# Patient Record
Sex: Male | Born: 1970 | Race: White | Hispanic: No | Marital: Single | Smoking: Never smoker
Health system: Western US, Academic
[De-identification: ages and names within clinical notes are randomized; demographics above are authoritative.]

---

## 2010-11-16 HISTORY — PX: OTHER PROCEDURE: U1053

## 2014-11-16 HISTORY — PX: KIDNEY STONE SURGERY: SHX686

## 2018-01-05 LAB — HEMOGLOBIN A1C - EXTERNAL
Estimated Mean Glucose: 100 mg/dL — NL
Hemoglobin A1C: 5.1 % — NL (ref <5.7)

## 2019-01-06 LAB — HEMOGLOBIN A1C - EXTERNAL
Estimated Mean Glucose: 100 mg/dL — NL
Hemoglobin A1C: 5.1 % — NL (ref <5.7)

## 2019-05-17 HISTORY — PX: URETEROLITHOTOMY: SHX71

## 2019-08-17 HISTORY — PX: URETHROPLASTY: SHX499

## 2019-10-17 HISTORY — PX: OTHER PROCEDURE: U1053

## 2020-06-20 ENCOUNTER — Other Ambulatory Visit: Payer: Self-pay

## 2020-06-20 ENCOUNTER — Other Ambulatory Visit (INDEPENDENT_AMBULATORY_CARE_PROVIDER_SITE_OTHER): Payer: Self-pay | Attending: Acute Care

## 2020-06-20 ENCOUNTER — Telehealth (INDEPENDENT_AMBULATORY_CARE_PROVIDER_SITE_OTHER): Payer: Self-pay | Admitting: Family

## 2020-06-20 DIAGNOSIS — Z1159 Encounter for screening for other viral diseases: Secondary | ICD-10-CM | POA: Insufficient documentation

## 2020-06-20 LAB — COVID-19 DETECTION ASSAY ASYM: COVID-19 Coronavirus Result: DETECTED — AB

## 2020-06-20 NOTE — Telephone Encounter (Signed)
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      Robin Searing, NP  Signature Derived From Controlled Access Password, June 20, 2020, 4:51 PM

## 2020-06-21 NOTE — Telephone Encounter (Signed)
Triage Assessment/Expected Management Plan:  First day of symptoms: 8/2  Expected last day of isolation: 8/12  Expected first day out of isolation: 8/13    Positive COVID-19 PCR 1st Call [employer] or 3rd Party Patient (NOT FOR [place] STUDENTS OR EMPLOYEES OR [place] PATIENTS)  PCP:  [facility]    Lab Results   Component Value Date    CO19 DETECTED (A) 06/20/2020         Triage:  1. What symptoms does the patient have today?  99.5 TEMP, MILD HEADACHE, LIGHTHEADED UNSURE IF DUE TO LOW B/P (HOWEVER, NOT @ THIS TIME !)  O2 : 95-98% , GETS LIGHTHEADED RANDOMLY, CAN CLIMB UP STAIRS JUST FINE    2. Was patient informed that if they have increasing SOB, new chest pain, coughing up blood after hours they should go the ED or call 911? Yes     3. Did you inform patient that they should contact their own PCP or health system if they have any further questions regarding management of their COVID infection? Yes    Isolation:  4. Date of first symptoms: 8/2  a. Length of Isolation (10 days or >10 days) Consider longer isolation period for patients with immune suppression due to chronic conditions like chemotherapy for cancer, HIV, chronic corticosteroid use etc.      DECLINED    5. Patient received home isolation instructions and all questions answered (smartphrase .[email or web address] be sent via MyChart to patient)?  Yes     6. Reviewed the 3 criteria for discharge from home isolation?  Yes     Contact Tracing:  7. Was close contact self quarantine information reviewed with the patient?  Yes    Close contacts of confirmed COVID-19 cases are required to self-quarantine for 10 days after their last contact with the infectious person. If they remain asymptomatic, after Day 10 they can discontinue quarantine on the condition that they follow these additional precautions from days 11-14:  o Strictly adhere to all routine COVID-19 prevention interventions including, wearing a face covering whenever around other people (or  Personal Protective Equipment, as required), keeping a distance of at least 6 feet from others, and washing hands often, AND   o Continue to monitor daily for COVID-19 symptoms. If symptoms develop, they must isolate immediately and contact their healthcare provider, clinician advice line, or telemedicine provider for a medical assessment and arrange a test for COVID-19.   Anne Shutter is not required after exposure in the following circumstances:  o Exposed people who had COVID-19 infection in the last 3 months (counting from the first day of symptoms or counting from the day of the positive test if asymptomatic)  o Fully vaccinated individuals meeting the following conditions:  - ?2 weeks following receipt of the second dose in a 2-dose series, or ?2 weeks following receipt of one dose of a single-dose vaccine  - Are within 3 months following receipt of the last dose in the series  - Have remained asymptomatic since the current COVID-19 exposure  8. Have you traveled out of the country in the last 2 weeks? No     Notify App:  9. Did the patient download and are they participating in the [place] COVID Notify App? No (If yes, then patient will need activation code from )    Monoclonal Antibody Therapy Eligibility:   10. Does the patient have mild/ moderate COVID-19 virus and meet criteria for Monoclonal Antibody Therapy? Yes    BMI: 26.3  HEIGHT 5'8  WEIGHT 173    COVID Vaccination Status:  11. Has the patient received any doses of the COVID vaccine: Yes     Date of 2nd vaccine:  April 2021   Which vaccine:  PFIZER      Benjamine Sprague, LVN  Signature Derived From Controlled Access Password, June 21, 2020, 11:36 AM

## 2020-06-21 NOTE — Telephone Encounter (Signed)
Provider Review Initial Call  Symptomatic Patient  First day of Symptoms: 8/2  Expected last day of isolation: 8/12  Expected First day out of isolation: 8/13  [facility] pt

## 2020-11-22 LAB — HEMOGLOBIN A1C - EXTERNAL
Estimated Mean Glucose: 105 mg/dL
Hemoglobin A1C: 5.3 % (ref ?–5.7)

## 2021-02-05 ENCOUNTER — Encounter (INDEPENDENT_AMBULATORY_CARE_PROVIDER_SITE_OTHER): Payer: Self-pay | Admitting: Cardiovascular Disease

## 2021-02-27 ENCOUNTER — Encounter (INDEPENDENT_AMBULATORY_CARE_PROVIDER_SITE_OTHER): Payer: Self-pay | Admitting: Cardiovascular Disease

## 2021-02-27 ENCOUNTER — Ambulatory Visit (INDEPENDENT_AMBULATORY_CARE_PROVIDER_SITE_OTHER): Admitting: Cardiovascular Disease

## 2021-02-27 VITALS — BP 133/84 | HR 67 | Temp 96.4°F | Resp 14 | Ht 68.75 in | Wt 176.0 lb

## 2021-02-27 MED ORDER — VITAMIN D3 1000 UNIT OR TABS: 1.00 | ORAL_TABLET | Freq: Two times a day (BID) | ORAL | Status: AC

## 2021-02-27 MED ORDER — OMEGA-3 FISH OIL 1000 MG PO CAPS: 2.00 g | ORAL_CAPSULE | Freq: Every day | ORAL | Status: AC

## 2021-02-27 NOTE — Progress Notes (Signed)
 [facility] Outpatient Note    Subjective: Jon Hernandez is a 50 year old male who presents to clinic for Establish Care and Referral/authorization (Derm, sleep specialist)    HPI   Establish care  Hx renal calculi, urethral stricture repair  Hx melanoma x 2- 2012, 2020  Normal colonoscopy 2020  No hx tob  5 beers/week  Runs 5 days/week  covid august- has vaccin and booster  Palpitations 10 yrs ago but resolved after starting cpap for osa    Review of Systems:     General - no fevers, chills, fatigue, unintentional weight loss, headache  Endocrine - no feeling unusually hot or cold (intolerance), hair loss  Oral - no bleeding from gums, open sores  Ear/Nose/Throat - no nose bleeds, sudden change in hearing, sore throat, runny nose  Lungs - no cough, shortness of breath, hemoptysis, wheezing  GI - no bloody or black stools, abdominal pain, nausea, vomiting, diarrhea, change in bowel habits  Heart - no chest pain, pressure, irregular heartbeat/palpitations, leg swelling  Neurologic - no weakness, numbness or tingling, problems walking  MusculoSkeletal - no new joint pain, joint swelling, or warmth/stiffness  Skin - no rash, no changing skin lesions, itching  Psychiatric - no depression, behavior changes, significant anxiety or sleep disturbance  Kidney - no changes in urination, blood in urine, pain with urination, frequency, nocturia  Heme - no unusual easy bleeding or bruising    Outpatient Medications Prior to Visit   Medication Sig Dispense Refill   . Cholecalciferol (VITAMIN D3) 25 MCG (1000 UT) tablet 1 tablet by Oral route in the morning and at bedtime.     Marland Kitchen omega-3 fatty acids, OTC, 1000 MG CAPS 2 g by Oral route daily.       No facility-administered medications prior to visit.     Immunization History   Administered Date(s) Administered   . COVID-19 (Moderna) 10/02/2020   . COVID-19 [employer]) Purple Cap 02/13/2020, 03/08/2020     Allergies   Allergen Reactions   . Penicillins Rash     There are no  problems to display for this patient.    Past Medical History:   Diagnosis Date   . OSA (obstructive sleep apnea)      Past Surgical History:   Procedure Laterality Date   . melanoma excision  10/2019   . URETHROPLASTY  08/2019   . URETEROLITHOTOMY  05/2019   . KIDNEY STONE SURGERY  2016    hospitalized 3 days   . melanoma excision  2012     Social History     Socioeconomic History   . Marital status: Single     Spouse name: Not on file   . Number of children: Not on file   . Years of education: Not on file   . Highest education level: Not on file   Occupational History   . Not on file   Tobacco Use   . Smoking status: Never Smoker   . Smokeless tobacco: Never Used   Substance and Sexual Activity   . Alcohol use: Yes     Alcohol/week: 5.0 standard drinks     Types: 5 Standard drinks or equivalent per week     Comment: twice a week   . Drug use: Never   . Sexual activity: Yes     Partners: Female     Birth control/protection: Condom   Other Topics Concern   . Not on file   Social History Narrative   .  Not on file     Social Determinants of Health     Financial Resource Strain: Not on file   Food Insecurity: Not on file   Transportation Needs: Not on file   Physical Activity: Not on file   Stress: Not on file   Social Connections: Not on file   Intimate Partner Violence: Not on file   Housing Stability: Not on file     Family History   Problem Relation Name Age of Onset   . Cancer Mother     . Cancer Father     . No Known Problems Maternal Grandmother     . No Known Problems Maternal Grandfather     . No Known Problems Paternal Grandmother     . No Known Problems Paternal Grandfather     . No Known Problems Brother       Family Status   Relation Status   . Mo (Not Specified)   . Fa (Not Specified)   . [facility] (Not Specified)   . MGFa (Not Specified)   . [facility] (Not Specified)   . PGFa (Not Specified)   . Bro (Not Specified)     Objective:  Vitals:    02/27/21 0813   BP: 133/84   BP Location: Left arm   BP Patient Position:  Sitting   BP cuff size: Regular   Pulse: 67   Resp: 14   Temp: 96.4 F (35.8 C)   TempSrc: Temporal   SpO2: 97%   Weight: 79.8 kg (176 lb)   Height: 5' 8.75" (1.746 m)     Body mass index is 26.18 kg/m.    Wt Readings from Last 5 Encounters:   02/27/21 79.8 kg (176 lb)     Blood Pressure   02/27/21 133/84     Physical Exam  Gen: in NAD, A&O, pleasant, non-toxic appearing  HEENT: NC/AT, no conjunctival pallor, PERRL. No icterus, ptosis. Oropharynx w/out exudates or erythema.  Moist mucous membranes.  Neck: No thyromegaly, no LAD.  Lungs: clear to auscultation bilaterally. No chest deformities noted.  No wheeze/rales/rhonchi   CV: regular rate & rhythm, nl S1, S2. No murmurs  Abdomen: Soft, non-tender, non-distended. No masses, organomegaly. No rebound tenderness.  Extremities: No cyanosis, erythema or edema. Warm, well perfused, capillary refill <2 sec  Neurologic: Mentation appropriate. No facial droop.  No tremor.  Musculoskeletal: no swelling of observed joints  Skin: exposed areas free of rash and no significant lesions     Labs:  Results for orders placed or performed in visit on 06/20/20   COVID-19 Detection Assay Asym   Result Value Ref Range    COVID-19 Source Nasal     COVID-19 Coronavirus Result DETECTED (A)      Imaging:  No results found.    ASSESSMENT AND PLAN:  1. Annual physical exam  wnl    2. OSA (obstructive sleep apnea)    - Consult/Referral to Sleep Medicine- nancy ludwick    3. Malignant melanoma, unspecified site (CMS-HCC)    - Dermatology Clinic-[facility]    4. Kidney stone  X 2 2016 and 2020    5. 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    Health Maintenance   Topic Date Due   . Hepatitis C Screening  Never done   . PHQ2 depression screen  Never done   . Colorectal Cancer Screening  Never done   . Tetanus (2 - Td or Tdap) 09/27/2022   . Influenza  Completed   . Shingles Vaccine  Completed   .  COVID-19 Vaccine  Completed   . Polio Vaccine  Aged Out   . HPV Vaccine <= 26 Yrs  Aged Out   . Meningococcal MCV4 Vaccine  Aged Out   . Pneumococcal Vaccine  Aged Out     Follow up  No follow-ups on file.    No future appointments.    Davina Poke, MD

## 2022-04-02 ENCOUNTER — Encounter (INDEPENDENT_AMBULATORY_CARE_PROVIDER_SITE_OTHER): Payer: Self-pay | Admitting: Cardiovascular Disease

## 2022-04-02 ENCOUNTER — Ambulatory Visit (INDEPENDENT_AMBULATORY_CARE_PROVIDER_SITE_OTHER): Admitting: Cardiovascular Disease

## 2022-04-02 VITALS — BP 131/71 | HR 65 | Temp 96.4°F | Resp 14 | Ht 68.75 in | Wt 170.0 lb

## 2022-04-02 NOTE — Progress Notes (Signed)
[facility] Outpatient Note    Subjective: Jon Hernandez is a 51 year old male who presents to clinic for Physical and Referral/authorization (Sleep medicine, dermatology)    HPI   Annual  Doing well  Needs ref to derm (hx melanoma) and needs supplies for CPAP    Review of Systems:     General - no fevers, chills, fatigue, unintentional weight loss, headache  Endocrine - no feeling unusually hot or cold (intolerance), hair loss  Oral - no bleeding from gums, open sores  Ear/Nose/Throat - no nose bleeds, sudden change in hearing, sore throat, runny nose  Lungs - no cough, shortness of breath, hemoptysis, wheezing  GI - no bloody or black stools, abdominal pain, nausea, vomiting, diarrhea, change in bowel habits  Heart - no chest pain, pressure, irregular heartbeat/palpitations, leg swelling  Neurologic - no weakness, numbness or tingling, problems walking  MusculoSkeletal - no new joint pain, joint swelling, or warmth/stiffness  Skin - no rash, no changing skin lesions, itching  Psychiatric - no depression, behavior changes, significant anxiety or sleep disturbance  Kidney - no changes in urination, blood in urine, pain with urination, frequency, nocturia  Heme - no unusual easy bleeding or bruising    Outpatient Medications Prior to Visit   Medication Sig Dispense Refill   . Cholecalciferol (VITAMIN D3) 25 MCG (1000 UT) tablet 1 tablet (1,000 Units) by Oral route in the morning and at bedtime.     Marland Kitchen omega-3 fatty acids, OTC, 1000 MG CAPS 2 capsules (2 g) by Oral route daily.       No facility-administered medications prior to visit.     Immunization History   Administered Date(s) Administered   . COVID-19 (Moderna) Red Cap >= 12 Years 10/02/2020   . COVID-19 [employer]) Purple Cap >= 12 Years 02/13/2020, 03/08/2020     Allergies   Allergen Reactions   . Penicillins Rash     There are no problems to display for this patient.    Past Medical History:   Diagnosis Date   . OSA (obstructive sleep apnea)      Past  Surgical History:   Procedure Laterality Date   . melanoma excision  10/2019   . URETHROPLASTY  08/2019   . URETEROLITHOTOMY  05/2019   . KIDNEY STONE SURGERY  2016    hospitalized 3 days   . melanoma excision  2012     Social History     Socioeconomic History   . Marital status: Single     Spouse name: Not on file   . Number of children: Not on file   . Years of education: Not on file   . Highest education level: Not on file   Occupational History   . Not on file   Tobacco Use   . Smoking status: Never     Passive exposure: Never   . Smokeless tobacco: Never   Vaping Use   . Vaping Use: Never used   Substance and Sexual Activity   . Alcohol use: Yes     Alcohol/week: 5.0 standard drinks of alcohol     Types: 5 Standard drinks or equivalent per week     Comment: twice a week   . Drug use: Never   . Sexual activity: Yes     Partners: Female     Birth control/protection: Condom   Other Topics Concern   . Not on file   Social History Narrative   . Not on file  Social Determinants of Health     Financial Resource Strain: Not on file   Food Insecurity: Not on file   Transportation Needs: Not on file   Physical Activity: Not on file   Stress: Not on file   Social Connections: Not on file   Intimate Partner Violence: Not on file   Housing Stability: Not on file     Family History   Problem Relation Name Age of Onset   . Cancer Mother     . Cancer Father     . No Known Problems Maternal Grandmother     . No Known Problems Maternal Grandfather     . No Known Problems Paternal Grandmother     . No Known Problems Paternal Grandfather     . No Known Problems Brother       Family Status   Relation Status   . Mo (Not Specified)   . Fa (Not Specified)   . [facility] (Not Spec[facility])   . MGFa (Not Specified)   . [facility] (Not [facility]   . PGFa (Not Specified)   . Bro (Not Specified)     Objective:  Vitals:    04/02/22 0955   BP: 131/71   BP Location: Left arm   BP Patient Position: Sitting   BP cuff size: Regular   Pulse: 65   Resp: 14    Temp: 96.4 F (35.8 C)   TempSrc: Temporal   SpO2: 96%   Weight: 77.1 kg (170 lb)   Height: 5' 8.75" (1.746 m)     Body mass index is 25.29 kg/m.    Wt Readings from Last 5 Encounters:   04/02/22 77.1 kg (170 lb)   02/27/21 79.8 kg (176 lb)     Blood Pressure   04/02/22 131/71   02/27/21 133/84     Physical Exam  Gen: in NAD, A&O, pleasant, non-toxic appearing  HEENT: NC/AT, no conjunctival pallor, PERRL. No icterus, ptosis. Oropharynx w/out exudates or erythema.  Moist mucous membranes.  Neck: No thyromegaly, no LAD.  Lungs: clear to auscultation bilaterally. No chest deformities noted.  No wheeze/rales/rhonchi   CV: regular rate & rhythm, nl S1, S2. No murmurs  Abdomen: Soft, non-tender, non-distended. No masses, organomegaly. No rebound tenderness.  Extremities: No cyanosis, erythema or edema. Warm, well perfused, capillary refill <2 sec  Neurologic: Mentation appropriate. No facial droop.  No tremor.  Musculoskeletal: no swelling of observed joints  Skin: exposed areas free of rash and no significant lesions     Labs:  Results for orders placed or performed in visit on 06/20/20   COVID-19 Detection Assay Asym   Result Value Ref Range    COVID-19 Source Nasal     COVID-19 Coronavirus Result DETECTED (A)      Imaging:  No results found.    ASSESSMENT AND PLAN:  1. Annual physical exam  WNL  - Comprehensive Metabolic Panel; Future  - CBC w/ Diff Lavender; Future  - Lipid Panel Green Plasma Separator Tube; Future  - PSA (Screen), Blood - See Instructions; Future  - Comprehensive Metabolic Panel  - CBC w/ Diff Lavender  - Lipid Panel Green Plasma Separator Tube  - PSA (Screen), Blood - See Instructions  - UA    2. OSA (obstructive sleep apnea)  For supplies for his CPAP  - Consult/Referral to Sleep Medicine [facility]    3. Kidney stone  No current symptoms but check UA    4. History of melanoma    -  Dermatology Clinic    5. Low vitamin D level  On supplement  - Vitamin D, 25-OH Total Yellow serum separator tube;  Future  - Vitamin D, 25-OH Total Yellow serum separator tube      There are no Patient Instructions on file for this visit.    Health Maintenance   Topic Date Due   . Colorectal Cancer Screening  Never done   . PHQ2 depression screen  Never done   . Hepatitis C Screening  Never done   . COVID-19 Vaccine (4 - Booster for Pfizer series) 11/27/2020   . Influenza (Season Ended) 06/16/2022   . Tetanus (2 - Td or Tdap) 09/27/2022   . Shingles Vaccine  Completed   . Polio Vaccine  Aged Out   . IMM_Hep A Vaccine Series  Aged Out   . HPV Vaccine <= 26 Yrs  Aged Out   . Meningococcal MCV4 Vaccine  Aged Out   . Pneumococcal Vaccine  Aged Out     Follow up  No follow-ups on file.    No future appointments.    Davina Poke, MD

## 2022-04-14 IMAGING — CT CT MAXILLOFACIAL W/O CM
3 series · 15 of 47 positions shown, 18 images · non-contrast
Comparison: None.

CLINICAL DATA: Other specified disorders of nose and nasal sinuses.
Intermittent difficulty breathing from the left nostril.

EXAM:
CT MAXILLOFACIAL WITHOUT CONTRAST
TECHNIQUE: Multidetector CT images of the paranasal sinuses were obtained using
the standard protocol without intravenous contrast.

[Series 2: max soft · axial · 0.31mm/px · z∈[-41,+73]mm · 9 of 67 slices shown, 12 images]
[im 5/67  brain]
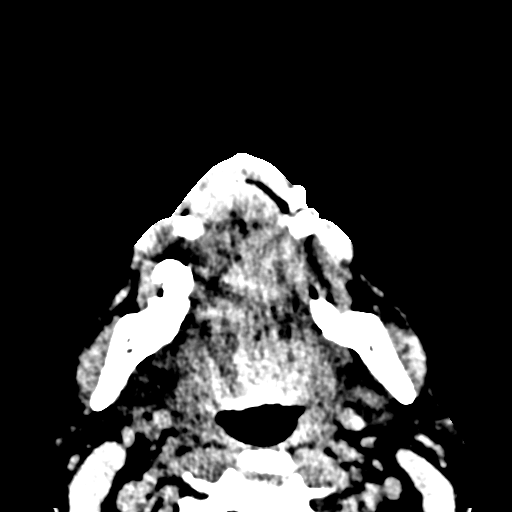
[im 5/67  bone]
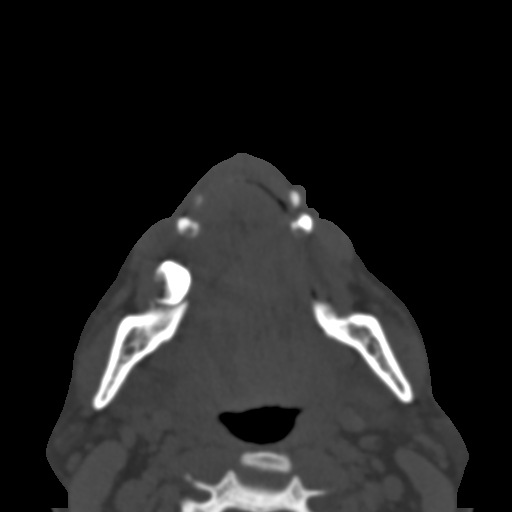
[im 12/67  bone]
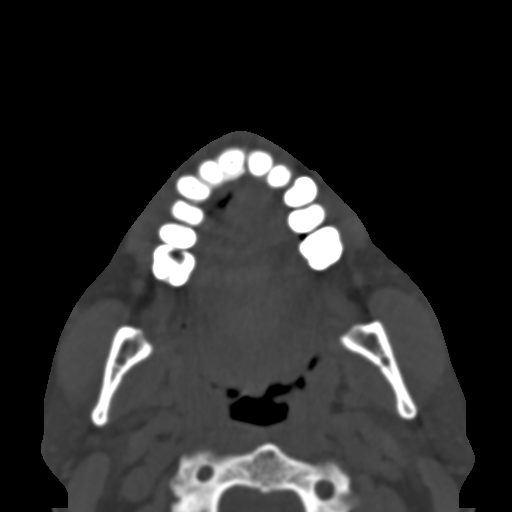
[im 19/67  bone]
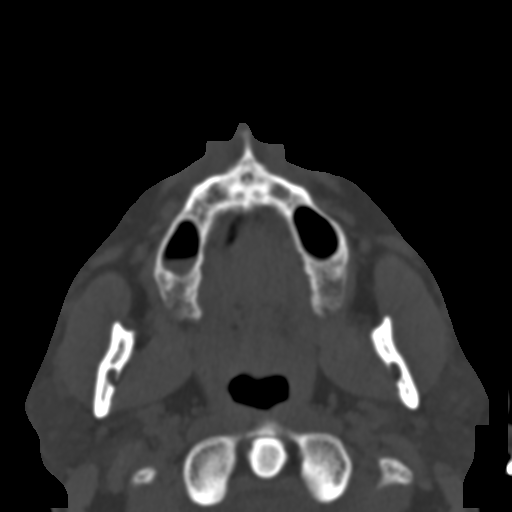
[im 26/67  bone]
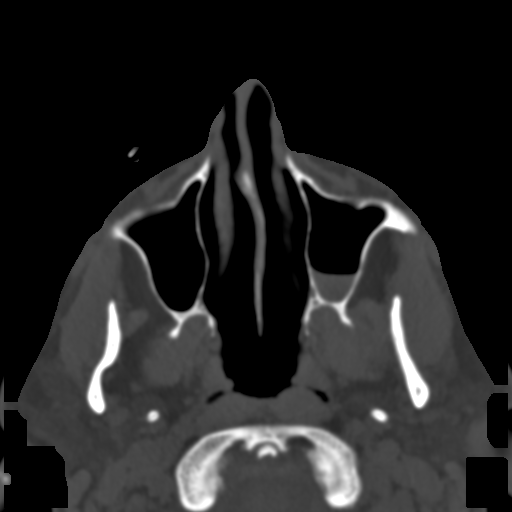
[im 35/67  brain]
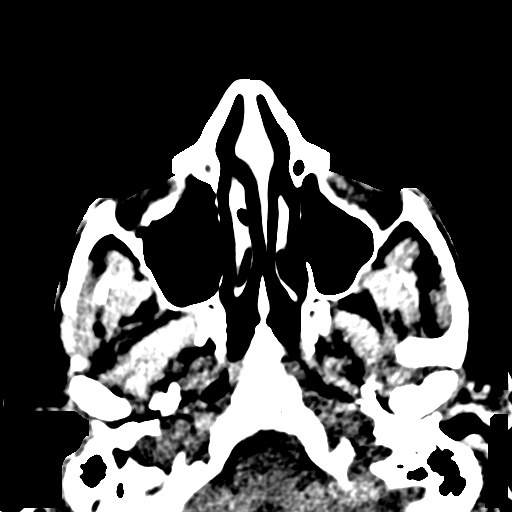
[im 35/67  bone]
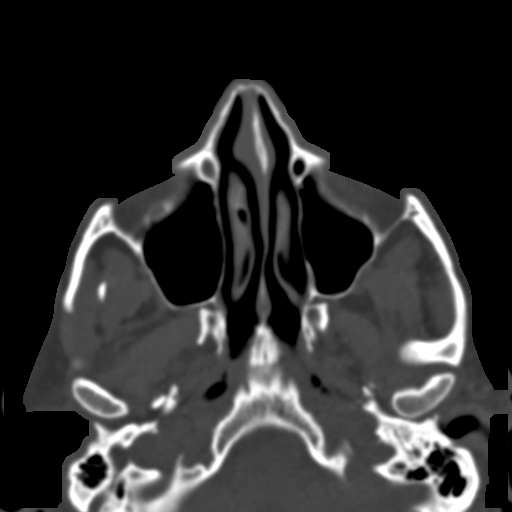
[im 41/67  bone]
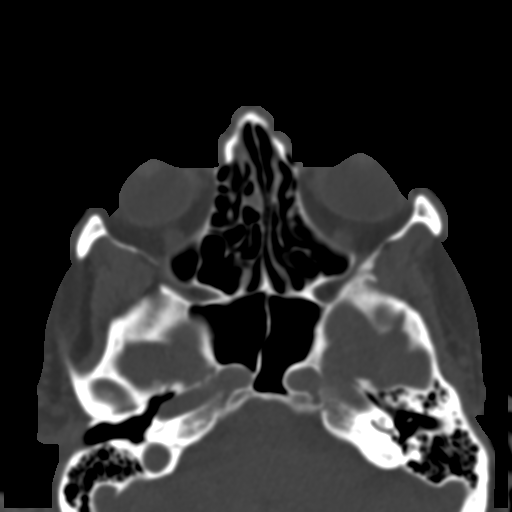
[im 48/67  bone]
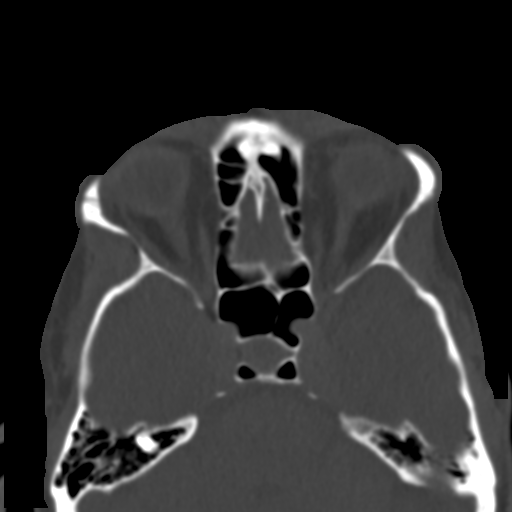
[im 55/67  bone]
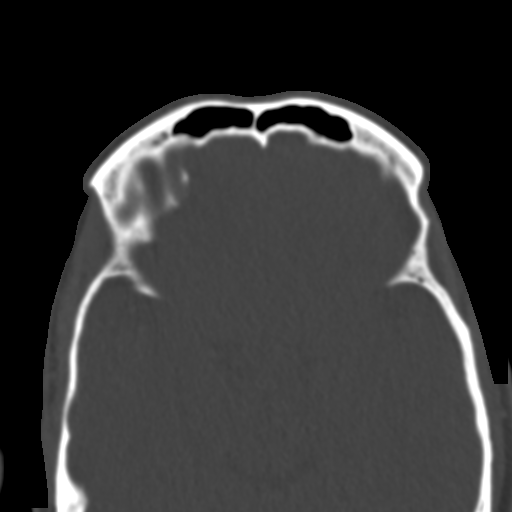
[im 62/67  brain]
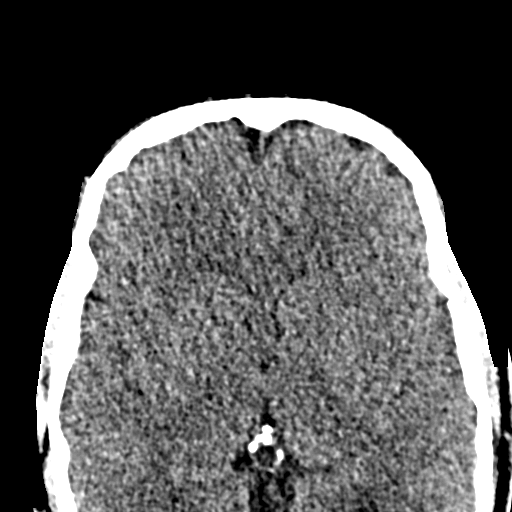
[im 62/67  bone]
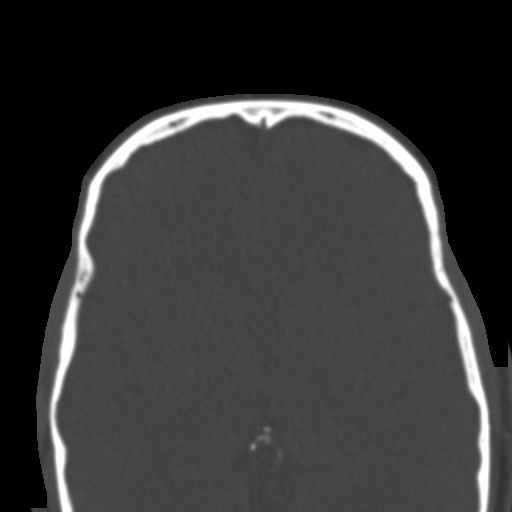

[Series 6: coronal soft · coronal · 0.30mm/px · 3 of 81 slices shown]
[im 27/81  bone]
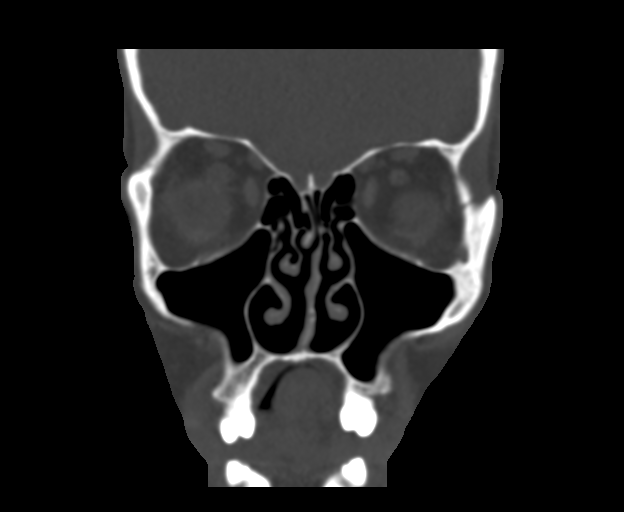
[im 36/81  bone]
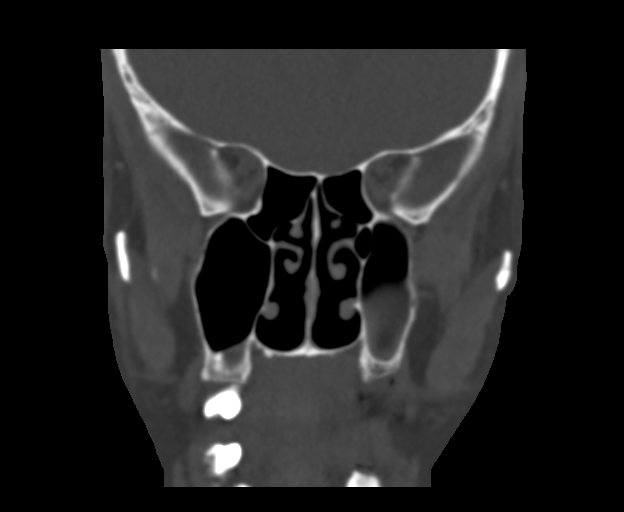
[im 45/81  bone]
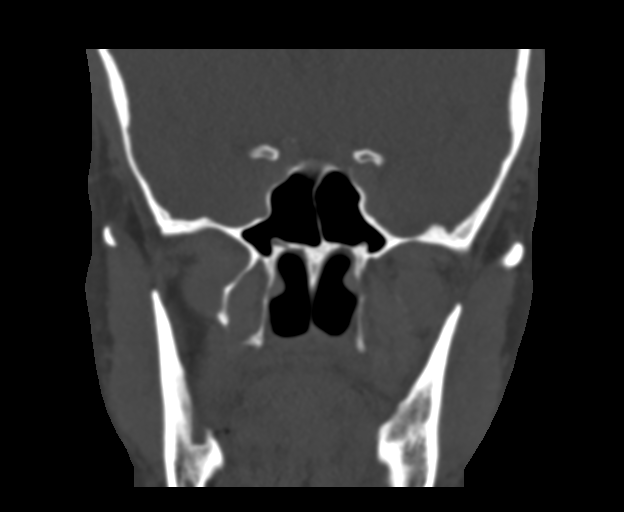

[Series 7: sagittal soft · sagittal · 0.29mm/px · 3 of 87 slices shown]
[im 29/87  bone]
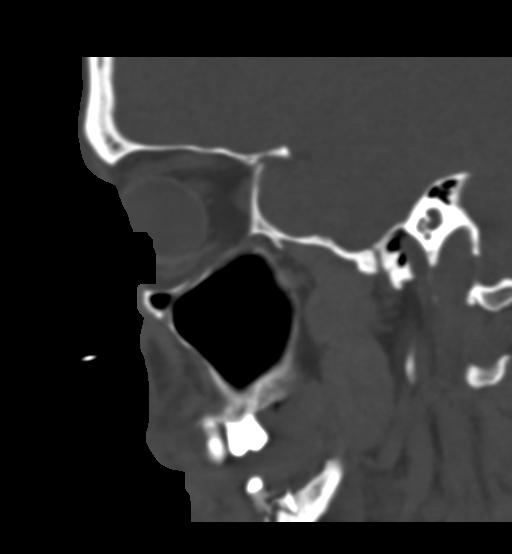
[im 44/87  bone]
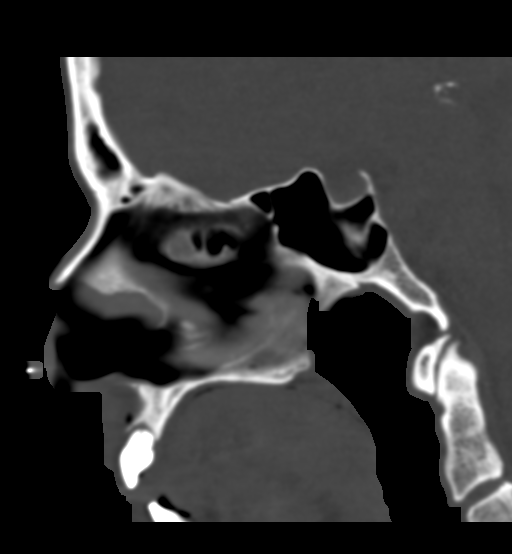
[im 58/87  bone]
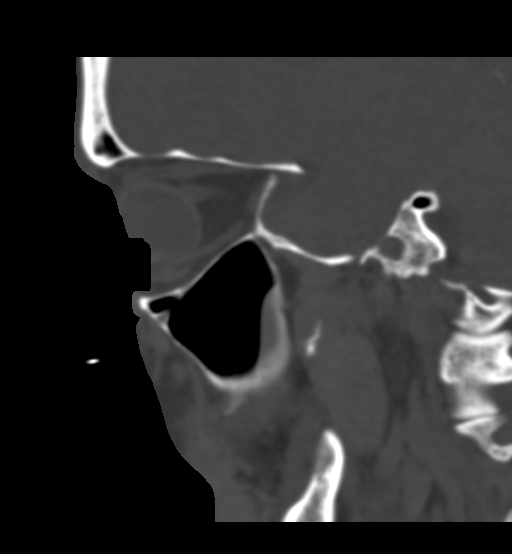

[15 of 47 positions shown; findings below may reference images not displayed]

FINDINGS: Paranasal sinuses:

Frontal: Normally aerated. Patent frontal sinus drainage pathways.

Ethmoid: Normally aerated.

Maxillary: Mild mucosal thickening bilaterally.

Sphenoid: Normally aerated. Patent sphenoethmoidal recesses.

Right ostiomeatal unit: Patent.

Left ostiomeatal unit: Patent.

Nasal passages: Right middle turbinate concha bullosa. Paradoxical
rotation of the right middle turbinate. 5 mm leftward deviation of
an intact nasal septum.

Anatomy: No pneumatization superior to anterior ethmoid notches.
Keros II. Sellar sphenoid pneumatization pattern. No dehiscence of
carotid or optic canals. No onodi cell.

Other: Clear mastoid air cells and tympanic cavities. Unremarkable
appearance of the orbits and included portion of the brain.
IMPRESSION: 1. Mild bilateral maxillary sinus mucosal thickening.
2. Leftward nasal septal deviation.

## 2022-06-24 ENCOUNTER — Encounter (INDEPENDENT_AMBULATORY_CARE_PROVIDER_SITE_OTHER): Payer: Self-pay | Admitting: Hospital

## 2022-07-21 ENCOUNTER — Ambulatory Visit (INDEPENDENT_AMBULATORY_CARE_PROVIDER_SITE_OTHER): Admitting: Family Medicine

## 2022-07-21 ENCOUNTER — Encounter (INDEPENDENT_AMBULATORY_CARE_PROVIDER_SITE_OTHER): Payer: Self-pay | Admitting: Family Medicine

## 2022-07-21 VITALS — BP 116/75 | HR 60 | Ht 69.5 in | Wt 175.0 lb

## 2022-07-21 NOTE — Progress Notes (Signed)
SLEEP MEDICINE INITIAL CONSULTATION    Jon Hernandez is a 51 year old male with a history of OSA on APAP, dyslipidemia seen today as a new consultation at the request of Davina Poke for the chief complaint of OSA follow up and management.  Patient was dx with OSA in 2013, AHI 27.8 and has been on PAP therapy since. Study was ordered for loud snoring, unrefreshing sleep and witnessed apneas by a partner at the time. He noted much benefit with PAP therapy. Patient has no concerns about PAP machine, mask or pressure. Doing well and clinically benefiting from therapy. Received dreamstation machine 02/2019 and replacement dreamstation2 11/2020. He presents to establish care- insurance change. Was seeing sleep at [facility], then Dr Jerre Simon. No sleep concerns at this time.    Sleep wake schedule:    Patient's time to bed is 1030 and wake up time is 6a. It takes him a few minutes to fall asleep. he  has 0-1 nocturnal awakenings due to urination. It takes a few minutes to fall back asleep. he  does maintain same schedule on weekends. Conran denies scheduled naps    Sleep Complaints:  Excessive daytime sleepiness: no  Nonrestorative sleep: no  Daytime fatigue: no    Cardio-respiratory- yes w/o pap  Snoring: no  Witnessed apneas: no  Waking up gasping/Choking: no  Waking up with heart pounding or racing: yes  Excessive perspiration: no    RLS screen: Unpleasant, restless feeling in legs in the evening or while sleeping at night that can be relieved by walking or movement: no    Parasomnias:   NREM:  Denies recurrent persistent confusional arousal, night eating, sleep walking or sleep terrors.  REM:  Denies dream reenactment or injuries  no Hypnogogic/hypnopompic hallucinations, sleep paralysis and cataplexy    Other subjective complaints:  History of mood disorder:no  History of pain: no  Nocturnal GERD: no  Dry mouth: no  Morning headache or confusion: no  Teeth grinding: no  Nocturia: 0-1 /night    Comorbid  conditions: OSA    Caffeine use: 12-24 oz  Sleep aids tried: n/a    Epworth Sleepiness Scale today is 3 (normal <10).     Review of Systems:  Refer to intake questionnaire    Past Medical and Surgical History:     Past Medical History:   Diagnosis Date    OSA (obstructive sleep apnea)      Past Surgical History:   Procedure Laterality Date    melanoma excision  10/2019    URETHROPLASTY  08/2019    URETEROLITHOTOMY  05/2019    KIDNEY STONE SURGERY  2016    hospitalized 3 days    melanoma excision  2012       Allergies: Penicillins.     Current Medications:     Current Outpatient Medications   Medication Sig    Cholecalciferol (VITAMIN D3) 25 MCG (1000 UT) tablet 1 tablet (1,000 Units) by Oral route in the morning and at bedtime.    omega-3 fatty acids, OTC, 1000 MG CAPS 2 capsules (2 g) by Oral route daily.     No current facility-administered medications for this visit.       Family History:    Family History   Problem Relation Name Age of Onset    Cancer Mother      Cancer Father      No Known Problems Maternal Grandmother      No Known Problems Maternal Grandfather  No Known Problems Paternal Grandmother      No Known Problems Paternal Grandfather      No Known Problems Brother         There is no known family history of obstructive sleep apnea.    Social History:    Alcohol, tobacco, illicit drug use: 0-5 drinks/week    Physical Examination:  Vitals: BP 116/75 (BP Location: Left arm, BP Patient Position: Sitting, BP cuff size: Regular)   Pulse 60   Ht 5' 9.5" (1.765 m)   Wt 79.4 kg (175 lb)   BMI 25.47 kg/m     Exam:  - Gen: Well-appearing, no distress.  - HEENT:   - General: Benign oropharynx without exudate or erythema.   - Airway/tonsils: Mallampati 3 airway. 1+ tonsils.   - Pharyngeal structure: No evidence of lateral pharyngeal wall mucosa hypertrophy. No evidence of high arched palate. Uvula is not enlarged or erythematous. Posterior oropharynx is shallow in the A/P diameter.   - Tongue: No  macroglossia.   - Chin: No micro/retrognathia.   - Nasal structure: No deviated septum.  - Neck: No thyromegaly. Grossly normal. Neck circumference: 15  - CV: Regular rate and rhythm, normal S1/S2, no m/g/r.  - Pulm: Clear breath sounds bilaterally. No crackles or wheezes. Nonlabored breathing.  - Ext: No clubbing or edema.    Significant Diagnostics:  PSG 08/11/2012 AHI 27.8 reported 05/24/2020, not available to me     CPAP adherence data from 06/11/22 to 07/10/22 show:  - Mode: APAP.  - Pressure 5-15 cm H2O.  - Median pressure 6.9 cm H2O and 95th percentile pressure 8.3 cm H2O.  - Used on 96.7% of days, with >4 hours on 83% of days.  - Average use: 6h75min (days used) and 5h50min (overall).  - Residual AHI of 2.7 events/hr.      Assessment and Plan:    Jon Hernandez is a 51 year old male seen today as a new consultation at the request of Davina Poke for the chief complaint of OSA follow up and management.    Patient has a h/o moderate OSA, on PAP, clinically benefitting from therapy.  New supplies ordered.   Ensure 7-9 hours of TST.  Patient is compliant with PAP usage of greater than 4 hours per night, 70% of nights per week.     Follow up yearly    I have reviewed the  pathogenesis of obstructive sleep apnea and the natural course of the disorder if left untreated. Treatment of OSA reduces risk of developing stroke, congestive heart failure, myocardial infarction and premature death.   Factors that may exacerbate OSA including weight gain, insufficient sleep, certain medications and alcohol consumption were discussed and behavioral modifications including weight loss and exercise were discussed.    The risks of hypersomnolence including drowsy driving were discussed    All questions were answered as able.  I spent more than 50% of our visit in education and counseling related to disease management for total time of 40 minutes.    Ladell Heads, MD, MBA  Diplomate in Sleep Medicine    Electronically  signed by Ladell Heads, MD on July 21, 2022 14:54

## 2022-07-21 NOTE — Interdisciplinary (Signed)
I, Jon Hernandez, roomed and reviewed all medications and allergies for the patient.     ER screening: none in past 3 months

## 2022-08-28 ENCOUNTER — Encounter (INDEPENDENT_AMBULATORY_CARE_PROVIDER_SITE_OTHER): Payer: Self-pay | Admitting: Hospital

## 2022-09-08 ENCOUNTER — Telehealth (INDEPENDENT_AMBULATORY_CARE_PROVIDER_SITE_OTHER): Payer: Self-pay | Admitting: Cardiovascular Disease

## 2022-09-08 NOTE — Telephone Encounter (Signed)
[facility] CALLED REGARDING MED REC REQUEST, INFORMED THEM NOTHING SCANNED TO PT CHART AS OF YET.

## 2022-09-10 NOTE — Telephone Encounter (Signed)
Albert from [facility] called to follow up on medical records request nothing has been scanned.

## 2022-09-14 NOTE — Telephone Encounter (Signed)
Maria from [facility] called to check if we have received the MR request I let her know we haven't she said she will refax it

## 2022-09-15 ENCOUNTER — Telehealth (INDEPENDENT_AMBULATORY_CARE_PROVIDER_SITE_OTHER): Payer: Self-pay | Admitting: Internal Medicine

## 2022-09-15 IMAGING — DX DG HAND COMPLETE 3+V*R*
3 series · 4 of 4 positions shown · non-contrast
Comparison: None Available.

CLINICAL DATA: Pain of right thumb. Right hand pain and swelling
for 2-3 days.

EXAM:
RIGHT HAND - COMPLETE 3+ VIEW

[hand pa]
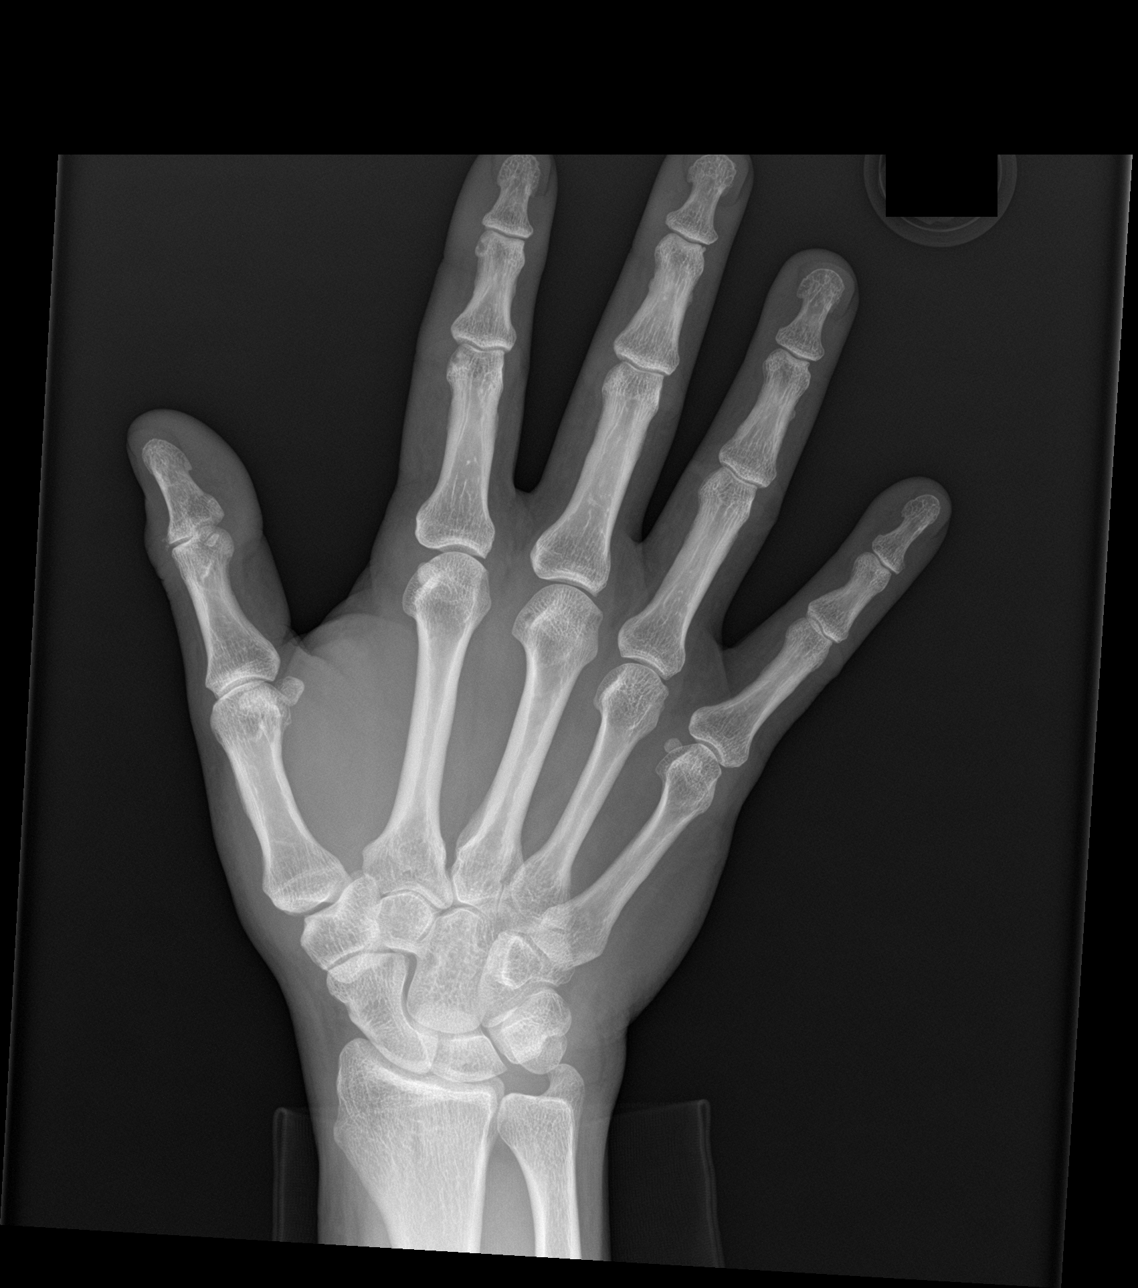

[hand obl]
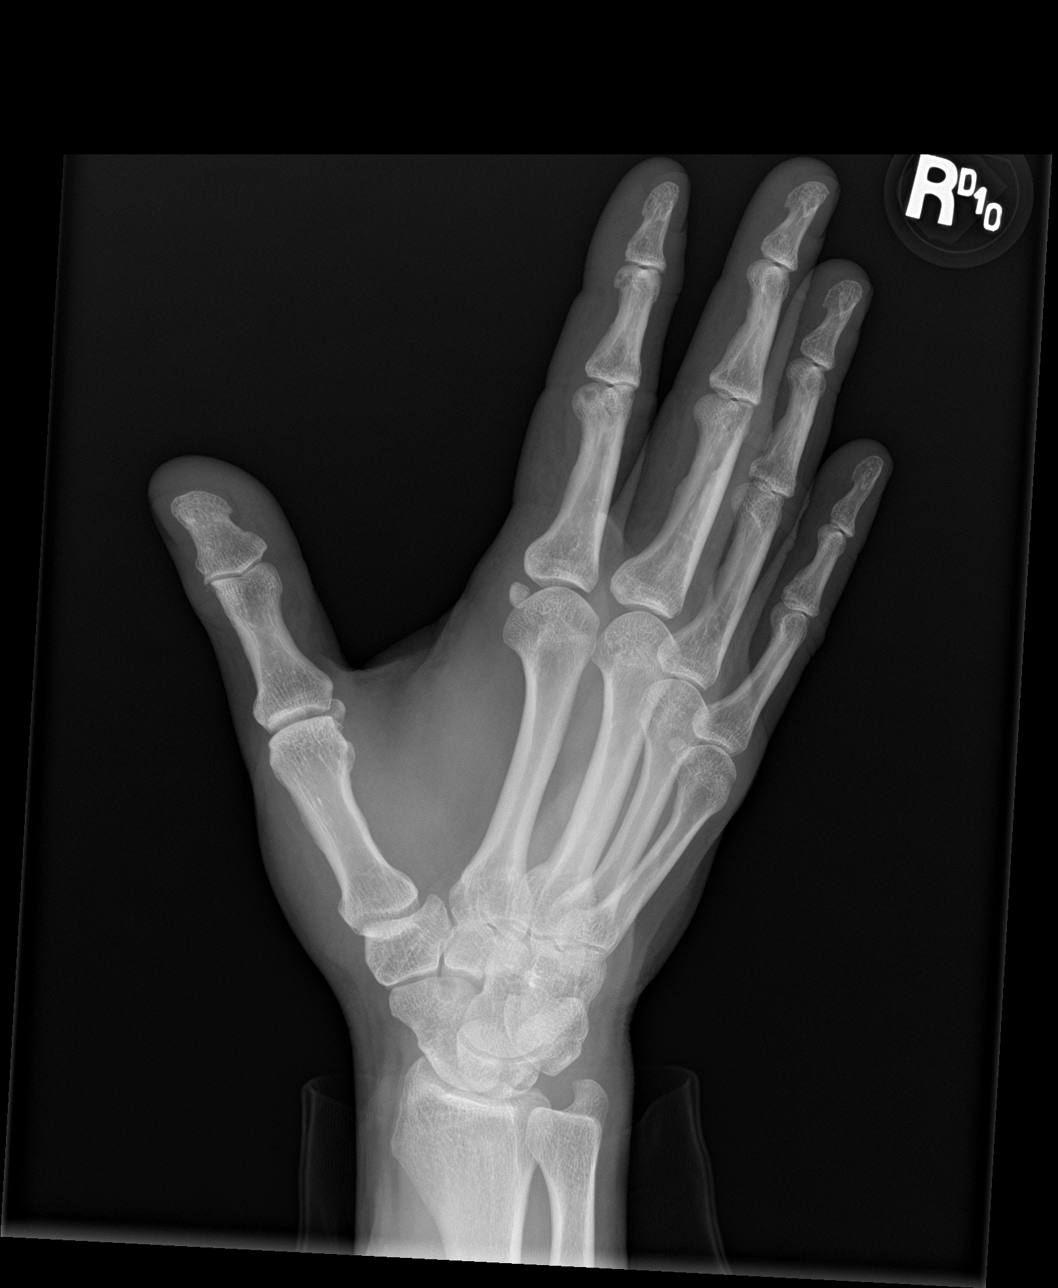

[Series 3: hand lat · 0.14mm/px · 2 of 2 slices shown]
[im 1/2]
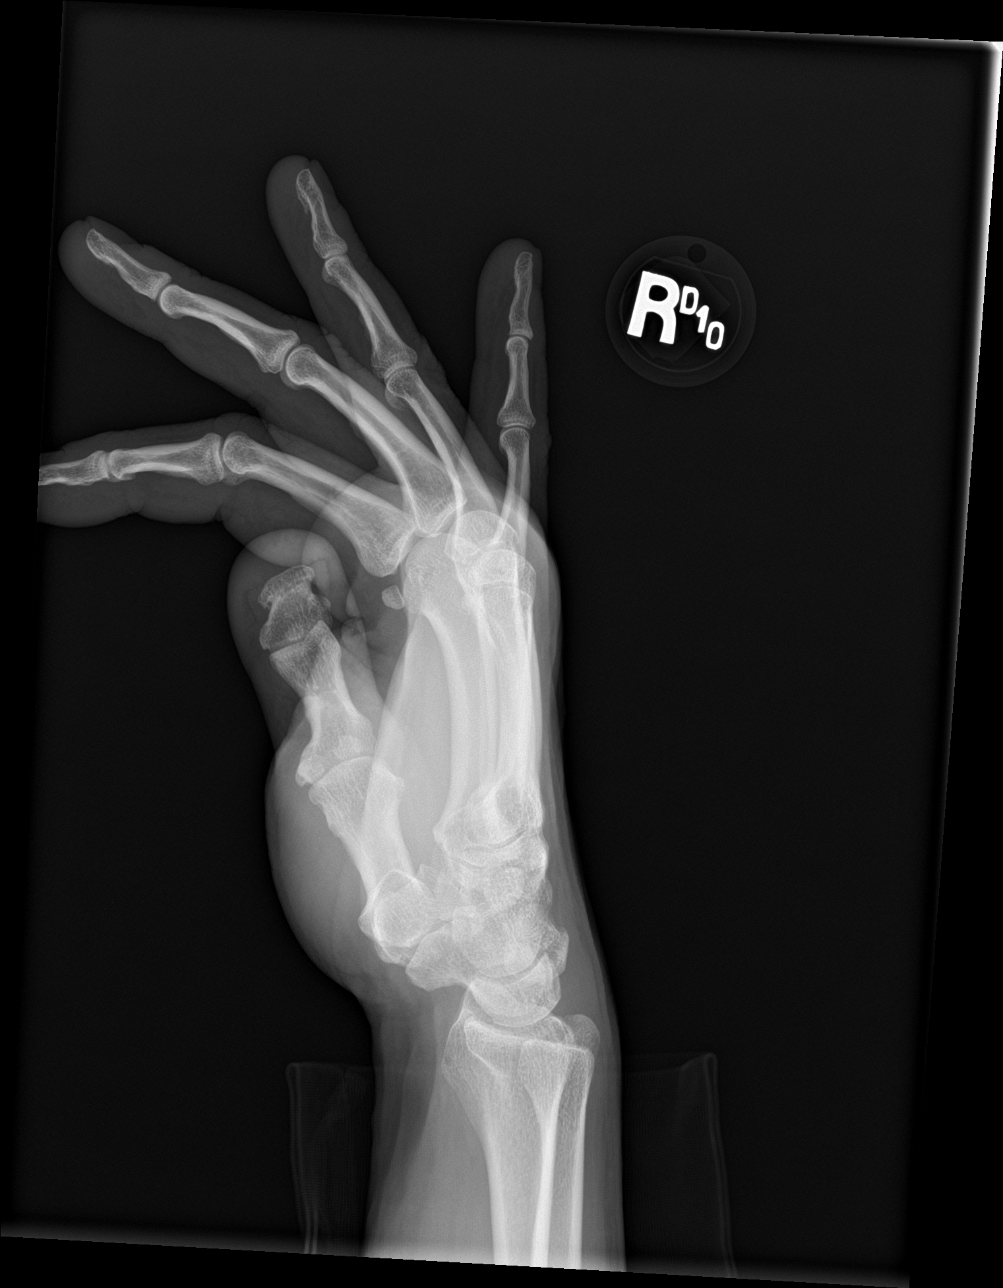
[im 2/2]
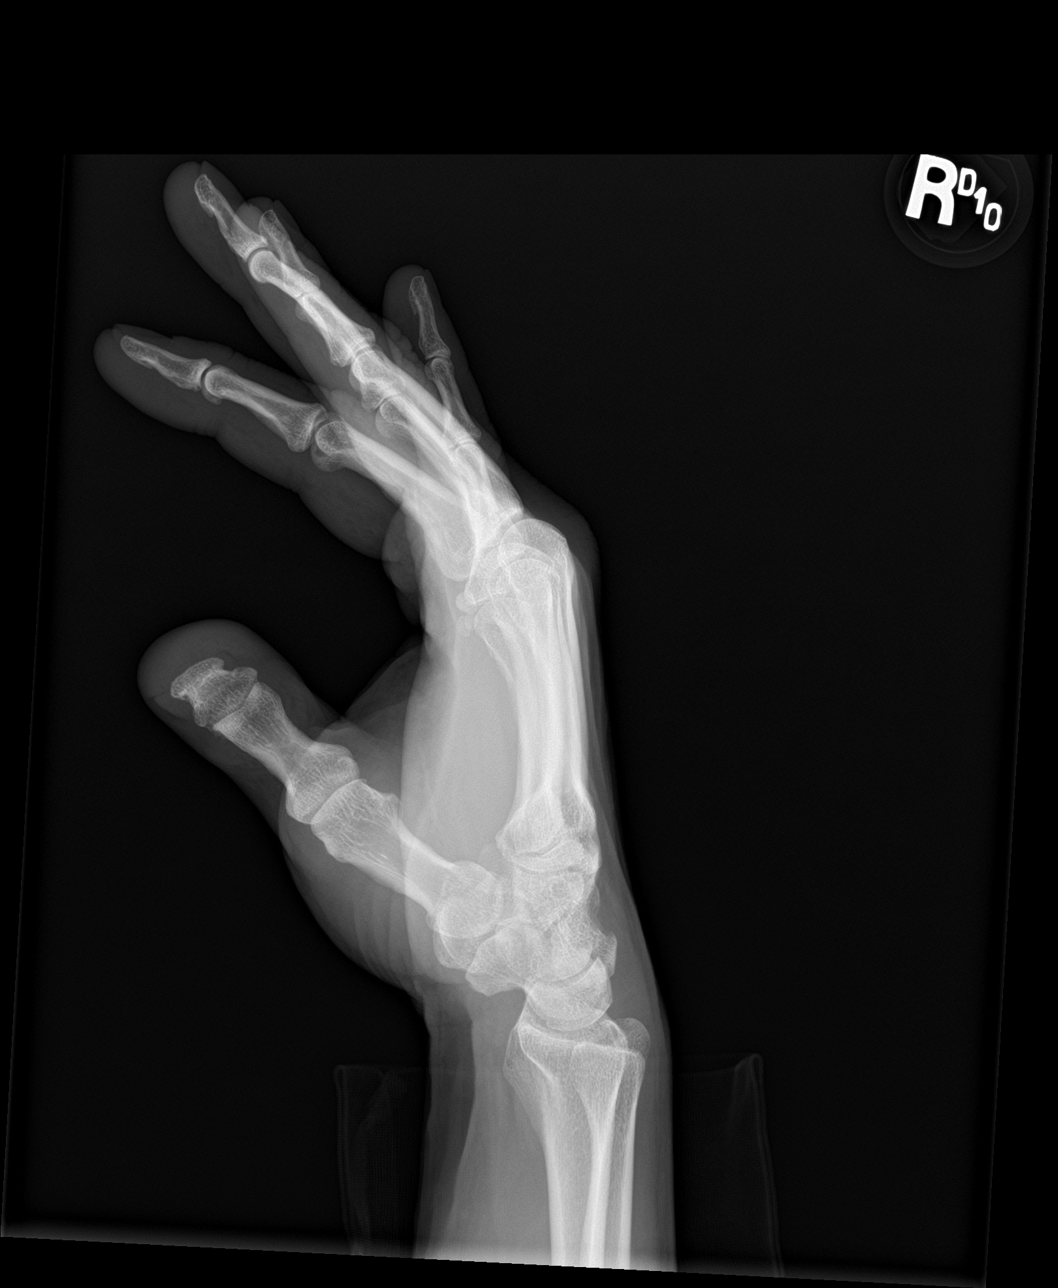

[4 of 4 positions shown; findings below may reference images not displayed]

FINDINGS: Normal bone mineralization. Mild thumb carpometacarpal, thumb
metacarpophalangeal, and interphalangeal joint space narrowing. Mild
peripheral osteophytosis at the thumb metacarpophalangeal joint.
Mild likely degenerative cystic change at the lateral aspect of the
proximal phalanx at the PIP joint and middle phalanx at the DIP
joint of the index finger. No acute fracture or dislocation.
IMPRESSION: Mild osteoarthritis, including within the thumb carpometacarpal,
metacarpophalangeal and interphalangeal joints.

## 2022-09-15 NOTE — Telephone Encounter (Signed)
MR faxed to [facility] to [phone]

## 2023-02-24 ENCOUNTER — Encounter (INDEPENDENT_AMBULATORY_CARE_PROVIDER_SITE_OTHER): Payer: Self-pay | Admitting: Hospital

## 2023-04-28 ENCOUNTER — Encounter (INDEPENDENT_AMBULATORY_CARE_PROVIDER_SITE_OTHER): Payer: Self-pay | Admitting: Hospital

## 2023-12-30 ENCOUNTER — Encounter (INDEPENDENT_AMBULATORY_CARE_PROVIDER_SITE_OTHER): Payer: Self-pay | Admitting: Hospital

## 2024-01-27 ENCOUNTER — Ambulatory Visit (INDEPENDENT_AMBULATORY_CARE_PROVIDER_SITE_OTHER): Payer: Self-pay

## 2024-02-07 ENCOUNTER — Encounter (INDEPENDENT_AMBULATORY_CARE_PROVIDER_SITE_OTHER): Payer: Self-pay
